# Patient Record
Sex: Male | Born: 1980 | Race: White | Hispanic: No | Marital: Single | Smoking: Never smoker
Health system: Southern US, Community
[De-identification: ages and names within clinical notes are randomized; demographics above are authoritative.]

## PROBLEM LIST (undated history)

## (undated) DIAGNOSIS — K5190 Ulcerative colitis, unspecified, without complications: Secondary | ICD-10-CM

## (undated) HISTORY — PX: TUMOR EXCISION: SHX421

## (undated) HISTORY — PX: ORTHOPEDIC SURGERY: SHX850

---
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
## 2008-12-12 ENCOUNTER — Emergency Department (HOSPITAL_COMMUNITY): Admission: EM | Admit: 2008-12-12 | Discharge: 2008-12-13 | Payer: Self-pay | Admitting: Internal Medicine

## 2009-06-23 ENCOUNTER — Emergency Department (HOSPITAL_COMMUNITY): Admission: EM | Admit: 2009-06-23 | Discharge: 2009-06-23 | Payer: Self-pay | Admitting: Emergency Medicine

## 2010-08-23 LAB — URINALYSIS, ROUTINE W REFLEX MICROSCOPIC
Glucose, UA: NEGATIVE mg/dL
Ketones, ur: NEGATIVE mg/dL
Leukocytes, UA: NEGATIVE
Nitrite: NEGATIVE
Specific Gravity, Urine: >1.03 — ABNORMAL HIGH (ref 1.005–1.030)
Urobilinogen, UA: 1 mg/dL (ref 0.0–1.0)
pH: 6 (ref 5.0–8.0)

## 2010-08-23 LAB — URINE MICROSCOPIC-ADD ON

## 2010-08-23 LAB — CBC
HCT: 41.8 % (ref 39.0–52.0)
Hemoglobin: 14.8 g/dL (ref 13.0–17.0)
MCHC: 35.5 g/dL (ref 30.0–36.0)
MCV: 85.1 fL (ref 78.0–100.0)
Platelets: 280 10*3/uL (ref 150–400)
RBC: 4.91 MIL/uL (ref 4.22–5.81)
RDW: 13.2 % (ref 11.5–15.5)
WBC: 9.6 10*3/uL (ref 4.0–10.5)

## 2010-08-23 LAB — PROTIME-INR
INR: 1 (ref 0.00–1.49)
Prothrombin Time: 13.6 seconds (ref 11.6–15.2)

## 2015-08-03 ENCOUNTER — Encounter (HOSPITAL_COMMUNITY): Payer: Self-pay

## 2015-08-03 ENCOUNTER — Emergency Department (HOSPITAL_COMMUNITY)
Admission: EM | Admit: 2015-08-03 | Discharge: 2015-08-04 | Disposition: A | Discharge status: Home / Self Care | Payer: Self-pay | Attending: Emergency Medicine | Admitting: Emergency Medicine

## 2015-08-03 DIAGNOSIS — J209 Acute bronchitis, unspecified: Secondary | ICD-10-CM | POA: Insufficient documentation

## 2015-08-03 HISTORY — DX: Ulcerative colitis, unspecified, without complications: K51.90

## 2015-08-03 LAB — URINALYSIS, ROUTINE W REFLEX MICROSCOPIC
Bilirubin Urine: NEGATIVE
GLUCOSE, UA: NEGATIVE mg/dL
Hgb urine dipstick: NEGATIVE
KETONES UR: NEGATIVE mg/dL
LEUKOCYTES UA: NEGATIVE
Nitrite: NEGATIVE
PROTEIN: NEGATIVE mg/dL
Specific Gravity, Urine: 1.02 (ref 1.005–1.030)
pH: 5 (ref 5.0–8.0)

## 2015-08-03 LAB — COMPREHENSIVE METABOLIC PANEL
ALK PHOS: 86 U/L (ref 38–126)
ALT: 31 U/L (ref 17–63)
AST: 23 U/L (ref 15–41)
Albumin: 3.9 g/dL (ref 3.5–5.0)
Anion gap: 5 (ref 5–15)
BILIRUBIN TOTAL: 0.4 mg/dL (ref 0.3–1.2)
BUN: 14 mg/dL (ref 6–20)
CALCIUM: 9 mg/dL (ref 8.9–10.3)
CO2: 28 mmol/L (ref 22–32)
CREATININE: 0.81 mg/dL (ref 0.61–1.24)
Chloride: 106 mmol/L (ref 101–111)
Glucose, Bld: 100 mg/dL — ABNORMAL HIGH (ref 65–99)
Potassium: 3.7 mmol/L (ref 3.5–5.1)
Sodium: 139 mmol/L (ref 135–145)
Total Protein: 7.3 g/dL (ref 6.5–8.1)

## 2015-08-03 LAB — CBC
HCT: 41.8 % (ref 39.0–52.0)
Hemoglobin: 14.4 g/dL (ref 13.0–17.0)
MCH: 29.5 pg (ref 26.0–34.0)
MCHC: 34.4 g/dL (ref 30.0–36.0)
MCV: 85.7 fL (ref 78.0–100.0)
PLATELETS: 282 10*3/uL (ref 150–400)
RBC: 4.88 MIL/uL (ref 4.22–5.81)
RDW: 12.9 % (ref 11.5–15.5)
WBC: 10.2 10*3/uL (ref 4.0–10.5)

## 2015-08-03 LAB — LIPASE, BLOOD: Lipase: 27 U/L (ref 11–51)

## 2015-08-03 NOTE — ED Notes (Addendum)
Patient states he got sick a month ago with cough/congestion and treated for flu. Patient states he has not improved, and today started vomiting X2. Patient also states central chest pain with pressure

## 2015-08-03 NOTE — ED Provider Notes (Signed)
CSN: [id number]     Arrival date & time 08/03/15  2115 History  By signing my name below, I, [facility] Center[facility]arrissa Washington, attest that this documentation has been prepared under the direction and in the presence of Gilda Creasehristopher J Dawnya Grams, MD. Electronically Signed: Randell PatientMarrissa Washington, ED Scribe. 08/03/2015. 11:47 PM.   Chief Complaint  Patient presents with  . Emesis   The history is provided by the patient. No language interpreter was used.   HPI Comments: Shon BatonFrank Vukelich is a 35 y.o. male who presents to the Emergency Department complaining of intermittent, mild emesis onset earlier today. Patient reports that he was diagnosed with the flu one month ago and has been seen multiple time for associated symptoms which have not improved despite treatment. He states that he has had recurrent fevers, intermittent cough productive of thick, chunks of blood-streaked phlegm, congestion, intermittent nosebleeds and rhinorrhea, and sore throat past 4 days. Denies any other symptoms.   Past Medical History  Diagnosis Date  . Colitis, ulcerative (HCC)    Past Surgical History  Procedure Laterality Date  . Tumor excision      groin  . Orthopedic surgery     History reviewed. No pertinent family history. Social History  Substance Use Topics  . Smoking status: Never Smoker   . Smokeless tobacco: None  . Alcohol Use: Yes     Comment: occasionaly    Review of Systems  Constitutional: Positive for fever.  HENT: Positive for congestion, nosebleeds, rhinorrhea and sore throat.   Respiratory: Positive for cough.   Gastrointestinal: Positive for vomiting.  All other systems reviewed and are negative.     Allergies  Codeine  Home Medications   Prior to Admission medications   Not on File   BP 133/94 mmHg  Pulse 88  Temp(Src) 98.6 F (37 C) (Oral)  Resp 19  Ht 5\' 8"  (1.727 m)  Wt 248 lb (112.492 kg)  BMI 37.72 kg/m2  SpO2 98% Physical Exam  Constitutional: He is oriented to person, place, and  time. He appears well-developed and well-nourished. No distress.  HENT:  Head: Normocephalic and atraumatic.  Right Ear: Hearing normal.  Left Ear: Hearing normal.  Nose: Nose normal.  Mouth/Throat: Mucous membranes are normal. Oropharyngeal exudate, posterior oropharyngeal edema and posterior oropharyngeal erythema present.  Tonsillar swelling, erythema and exudate  Eyes: Conjunctivae and EOM are normal. Pupils are equal, round, and reactive to light.  Neck: Normal range of motion. Neck supple.  Cardiovascular: Regular rhythm, S1 normal and S2 normal.  Exam reveals no gallop and no friction rub.   No murmur heard. Pulmonary/Chest: Effort normal and breath sounds normal. No respiratory distress. He exhibits no tenderness.  Abdominal: Soft. Normal appearance and bowel sounds are normal. There is no hepatosplenomegaly. There is no tenderness. There is no rebound, no guarding, no tenderness at McBurney's point and negative Murphy's sign. No hernia.  Musculoskeletal: Normal range of motion.  Neurological: He is alert and oriented to person, place, and time. He has normal strength. No cranial nerve deficit or sensory deficit. Coordination normal. GCS eye subscore is 4. GCS verbal subscore is 5. GCS motor subscore is 6.  Skin: Skin is warm, dry and intact. No rash noted. No cyanosis.  Psychiatric: He has a normal mood and affect. His speech is normal and behavior is normal. Thought content normal.  Nursing note and vitals reviewed.   ED Course  Procedures   DIAGNOSTIC STUDIES: Oxygen Saturation is 98% on RA, normal by my interpretation.  COORDINATION OF CARE: 11:44 PM Discussed treatment plan with pt at bedside and pt agreed to plan.   Labs Review Labs Reviewed  COMPREHENSIVE METABOLIC PANEL - Abnormal; Notable for the following:    Glucose, Bld 100 (*)    All other components within normal limits  RAPID STREP SCREEN (NOT AT [facility])  LIPASE, BLOOD  CBC  URINALYSIS, ROUTINE W REFLEX  MICROSCOPIC (NOT AT [facility])    Imaging Review No results found. I have personally reviewed and evaluated these images and lab results as part of my medical decision-making.   EKG Interpretation None      MDM   Final diagnoses:  None   bronchitis  Patient presents to the ER for evaluation of fever with persistent cough. Patient reports that he has been sick for a month, but in the last 4 days or so his cough has worsened and he has developed a sore throat. He was initially treated for the flu, but symptoms have persisted. He has had vomiting 2 times today. He has not had any diarrhea. Abdominal exam is benign. Patient likely has more than 1 problem. He has progressively worsening upper respiratory infection symptoms currently that have been persistent for several weeks. Will treat as a mucopurulent bronchitis. Chest x-ray does not show any obvious pneumonia. Rapid strep was negative. Remainder of labs were normal.  I personally performed the services described in this documentation, which was scribed in my presence. The recorded information has been reviewed and is accurate.    Gilda Crease, MD 08/04/15 [phone]

## 2015-08-04 ENCOUNTER — Emergency Department (HOSPITAL_COMMUNITY): Payer: Self-pay

## 2015-08-04 ENCOUNTER — Other Ambulatory Visit (HOSPITAL_COMMUNITY): Payer: Self-pay

## 2015-08-04 LAB — RAPID STREP SCREEN (MED CTR MEBANE ONLY): STREPTOCOCCUS, GROUP A SCREEN (DIRECT): NEGATIVE

## 2015-08-04 MED ORDER — AZITHROMYCIN 250 MG PO TABS: 250.0000 mg | ORAL_TABLET | Freq: Every day | ORAL | Status: AC

## 2015-08-04 MED ORDER — PROMETHAZINE HCL 25 MG PO TABS: 25.0000 mg | ORAL_TABLET | Freq: Four times a day (QID) | ORAL | Status: AC | PRN

## 2015-08-04 MED ORDER — HYDROCODONE-HOMATROPINE 5-1.5 MG/5ML PO SYRP: 5.0000 mL | ORAL_SOLUTION | Freq: Four times a day (QID) | ORAL | Status: AC | PRN

## 2015-08-04 MED ORDER — PREDNISONE 20 MG PO TABS
40.0000 mg | ORAL_TABLET | Freq: Every day | ORAL | Status: AC
Start: 2015-08-04 — End: ?

## 2015-08-04 NOTE — Discharge Instructions (Signed)

## 2015-08-06 LAB — CULTURE, GROUP A STREP (THRC)

## 2016-07-28 ENCOUNTER — Emergency Department (HOSPITAL_COMMUNITY)
Admission: EM | Admit: 2016-07-28 | Discharge: 2016-07-29 | Disposition: A | Discharge status: Home / Self Care | Payer: PRIVATE HEALTH INSURANCE | Attending: Emergency Medicine | Admitting: Emergency Medicine

## 2016-07-28 ENCOUNTER — Encounter (HOSPITAL_COMMUNITY): Payer: Self-pay

## 2016-07-28 DIAGNOSIS — J029 Acute pharyngitis, unspecified: Secondary | ICD-10-CM | POA: Diagnosis not present

## 2016-07-28 DIAGNOSIS — R69 Illness, unspecified: Secondary | ICD-10-CM

## 2016-07-28 DIAGNOSIS — R05 Cough: Secondary | ICD-10-CM | POA: Diagnosis present

## 2016-07-28 DIAGNOSIS — J111 Influenza due to unidentified influenza virus with other respiratory manifestations: Secondary | ICD-10-CM

## 2016-07-28 LAB — RAPID STREP SCREEN (MED CTR MEBANE ONLY): Streptococcus, Group A Screen (Direct): NEGATIVE

## 2016-07-28 MED ORDER — ACETAMINOPHEN 325 MG PO TABS
650.0000 mg | ORAL_TABLET | Freq: Once | ORAL | Status: AC
  Administered 2016-07-29: 650 mg via ORAL
  Filled 2016-07-28: qty 2

## 2016-07-28 NOTE — ED Triage Notes (Signed)
Pt c/o cough, sore throat, chills, body aches, headache onset tonight.  Pt does not know if he has had a fever.

## 2016-07-28 NOTE — ED Provider Notes (Signed)
[facility]-EMERGENCY DEPT Provider Note   CSN: [id number] Arrival date & time: 07/28/16  2259  By signing my name below, I, [name], attest that this documentation has been prepared under the direction and in the presence of Glynn OctaveStephen Jenah Vanasten, MD. Electronically Signed: [name], ED Scribe. 07/28/16. 11:29 PM.    History   Chief Complaint Chief Complaint  Patient presents with  . Cough    HPI Ryan BatonFrank Estrada is a 36 y.o. male who presents to the Emergency Department complaining of sore throat since this morning. Associated sx include chills, weakness, vomiting, productive cough and fever of 101.7(~3 hours ago). He states he hasn't eaten since 11 am. Pt got influenza shot in the fall. No one at home has been sick. 2 episodes of post tussive emesis. Has had headache for several days.  Pt denies loose stool or blood in stool.  PCP: Lorelei PontMAHONEY,MARK, DO  New gastro doctor in April. Dr patel   The history is provided by the patient. No language interpreter was used.    Past Medical History:  Diagnosis Date  . Colitis, ulcerative (HCC)     There are no active problems to display for this patient.   Past Surgical History:  Procedure Laterality Date  . ORTHOPEDIC SURGERY    . TUMOR EXCISION     groin       Home Medications    Prior to Admission medications   Medication Sig Start Date End Date Taking? Authorizing Provider  azithromycin (ZITHROMAX) 250 MG tablet Take 1 tablet (250 mg total) by mouth daily. Take first 2 tablets together, then 1 every day until finished. 08/04/15   Gilda Creasehristopher J Pollina, MD  HYDROcodone-homatropine (HYCODAN) 5-1.5 MG/5ML syrup Take 5 mLs by mouth every 6 (six) hours as needed for cough. 08/04/15   Gilda Creasehristopher J Pollina, MD  predniSONE (DELTASONE) 20 MG tablet Take 2 tablets (40 mg total) by mouth daily with breakfast. 08/04/15   Gilda Creasehristopher J Pollina, MD  promethazine (PHENERGAN) 25 MG tablet Take 1 tablet (25 mg total) by mouth every 6 (six)  hours as needed for nausea or vomiting. 08/04/15   Gilda Creasehristopher J Pollina, MD    Family History No family history on file.  Social History Social History  Substance Use Topics  . Smoking status: Never Smoker  . Smokeless tobacco: Never Used  . Alcohol use Yes     Comment: occasionaly     Allergies   Codeine   Review of Systems Review of Systems  All other systems reviewed and are negative.   A complete 10 system review of systems was obtained and all systems are negative except as noted in the HPI and PMH.    Physical Exam Updated Vital Signs BP 135/95 (BP Location: Right Arm)   Pulse 89   Temp 97.9 F (36.6 C) (Oral)   Resp 20   Ht 5\' 9"  (1.753 m)   Wt 240 lb (108.9 kg)   SpO2 97%   BMI 35.44 kg/m   Physical Exam  Constitutional: He is oriented to person, place, and time. He appears well-developed and well-nourished. No distress.  HENT:  Head: Normocephalic and atraumatic.  Right Ear: Hearing normal.  Left Ear: Hearing normal.  Nose: Nose normal.  Mouth/Throat: Oropharynx is clear and moist and mucous membranes are normal.  Mild oropharynx erythema. No exudates or asymmetry.  Eyes: Conjunctivae and EOM are normal. Pupils are equal, round, and reactive to light.  Neck: Normal range of motion. Neck supple.  Cardiovascular: Regular rhythm,  S1 normal and S2 normal.  Exam reveals no gallop and no friction rub.   No murmur heard. Pulmonary/Chest: Effort normal and breath sounds normal. No respiratory distress. He exhibits no tenderness.  Abdominal: Soft. Normal appearance and bowel sounds are normal. There is no hepatosplenomegaly. There is no tenderness. There is no rebound, no guarding, no tenderness at McBurney's point and negative Murphy's sign. No hernia.  Musculoskeletal: Normal range of motion.  Neurological: He is alert and oriented to person, place, and time. He has normal strength. No cranial nerve deficit or sensory deficit. Coordination normal. GCS eye  subscore is 4. GCS verbal subscore is 5. GCS motor subscore is 6.  Skin: Skin is warm, dry and intact. No rash noted. No cyanosis.  Psychiatric: He has a normal mood and affect. His speech is normal and behavior is normal. Thought content normal.  Nursing note and vitals reviewed.    ED Treatments / Results  DIAGNOSTIC STUDIES: Oxygen Saturation is 100% on RA, normal by my interpretation.   COORDINATION OF CARE: 11:29 PM-Discussed next steps with pt. Pt verbalized understanding and is agreeable with the plan.    Labs (all labs ordered are listed, but only abnormal results are displayed) Labs Reviewed  RAPID STREP SCREEN (NOT AT [facility])  CULTURE, GROUP A STREP [facility])    EKG  EKG Interpretation None       Radiology No results found.  Procedures Procedures (including critical care time)  Medications Ordered in ED Medications - No data to display   Initial Impression / Assessment and Plan / ED Course  I have reviewed the triage vital signs and the nursing notes.  Pertinent labs & imaging results that were available during my care of the patient were reviewed by me and considered in my medical decision making (see chart for details).     Patient with one day of influenza-like illness including cough, sore throat, chills, body aches and headache. 2 episodes of posttussive emesis. Fever to 101 at home but denies taking any medications.  Nontoxic and well-hydrated. Lungs are clear. Abdomen soft and nontender. History of Crohn's disease but states no change in his usual abdominal pain. No diarrhea or blood in his vomit. Rapid strep negative.  Abdominal exam benign.   Risks and benefits of Tamiflu discussed with patient and accepted. Patient understands viral course of illness and need for hydration at home, antipyretics and PCP follow-up.  Final Clinical Impressions(s) / ED Diagnoses   Final diagnoses:  Influenza-like illness    New Prescriptions New Prescriptions     No medications on file   I personally performed the services described in this documentation, which was scribed in my presence. The recorded information has been reviewed and is accurate.    Glynn Octave, MD 07/29/16 [phone]

## 2016-07-28 NOTE — ED Notes (Signed)
Pt states started feeling bad today. Body aches & chills. Pt denies taking anything before coming to the ER.

## 2016-07-29 MED ORDER — BENZONATATE 100 MG PO CAPS
100.0000 mg | ORAL_CAPSULE | Freq: Three times a day (TID) | ORAL | 0 refills | Status: AC
Start: 2016-07-29 — End: ?

## 2016-07-29 MED ORDER — OSELTAMIVIR PHOSPHATE 75 MG PO CAPS: 75.0000 mg | ORAL_CAPSULE | Freq: Two times a day (BID) | ORAL | 0 refills | Status: AC

## 2016-07-29 NOTE — Discharge Instructions (Signed)
Keep yourself hydrated.  Follow-up with your doctor.  Return to the ED if you develop new or worsening symptoms. °

## 2016-07-29 NOTE — ED Notes (Signed)
Pt alert & oriented x4, stable gait. Patient given discharge instructions, paperwork & prescription(s). Patient  instructed to stop at the registration desk to finish any additional paperwork. Patient verbalized understanding. Pt left department w/ no further questions. 

## 2016-07-31 LAB — CULTURE, GROUP A STREP (THRC)

## 2017-03-01 IMAGING — DX DG CHEST 2V
2 series · 2 of 2 positions shown · non-contrast
Comparison: None.

CLINICAL DATA: Dyspnea for 1 month.  Fever.

EXAM:
CHEST  2 VIEW

[chest pa]
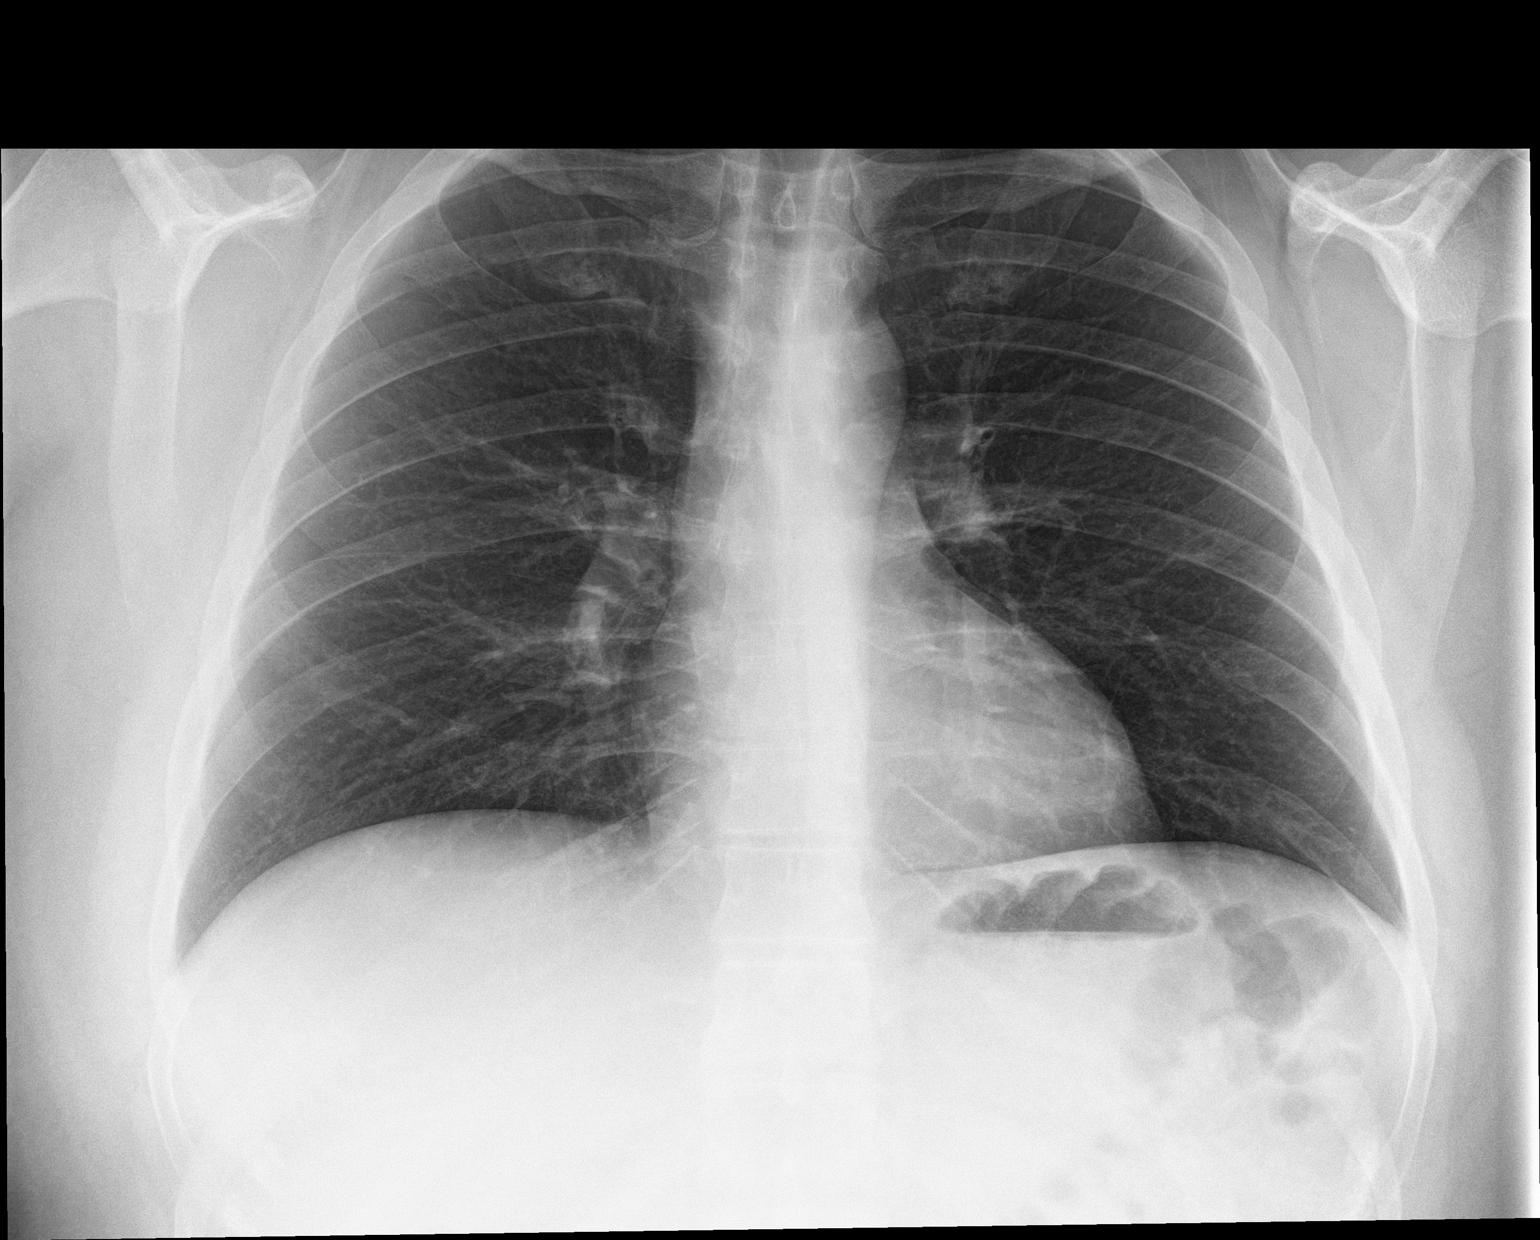

[chest lat]
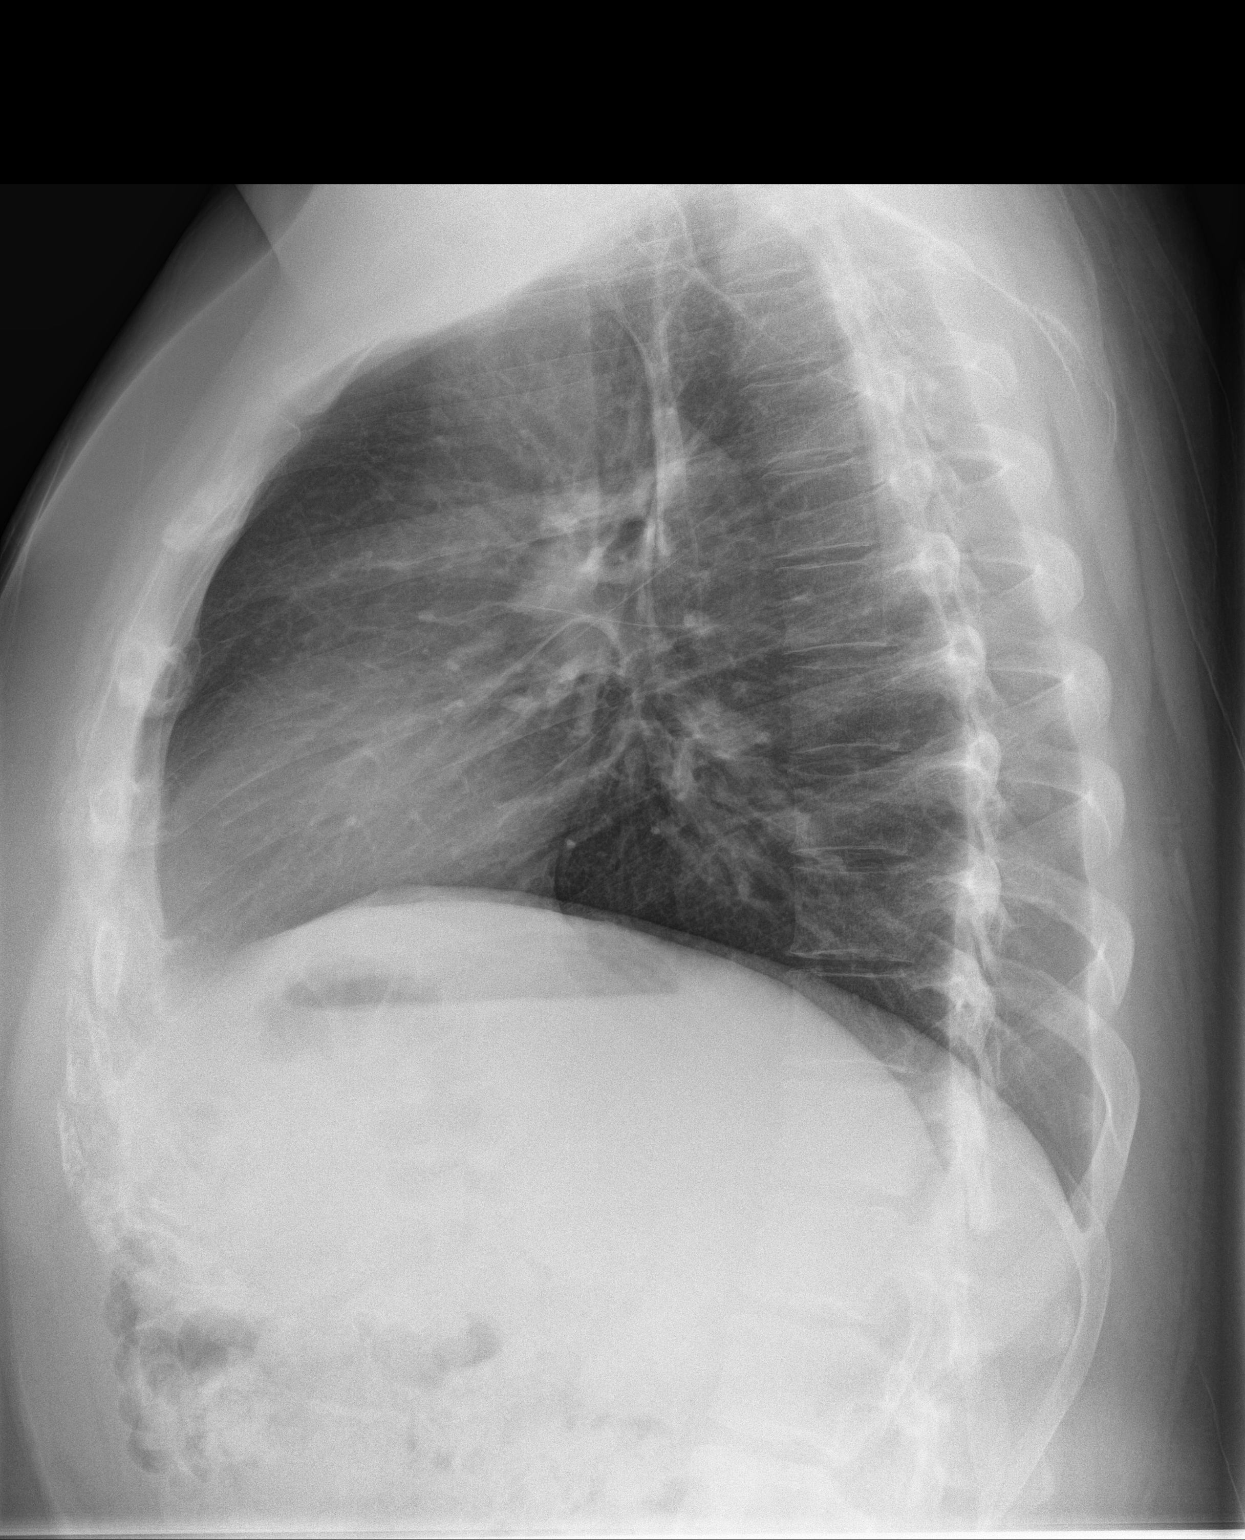

[2 of 2 positions shown; findings below may reference images not displayed]

FINDINGS: The heart size and mediastinal contours are within normal limits.
Both lungs are clear. The visualized skeletal structures are
unremarkable.
IMPRESSION: No active cardiopulmonary disease.
